# Patient Record
Sex: Male | Born: 1975 | Race: White | Hispanic: No | Marital: Single | Smoking: Never smoker
Health system: Southern US, Community
[De-identification: ages and names within clinical notes are randomized; demographics above are authoritative.]

## PROBLEM LIST (undated history)

## (undated) DIAGNOSIS — A4902 Methicillin resistant Staphylococcus aureus infection, unspecified site: Secondary | ICD-10-CM

## (undated) DIAGNOSIS — Z87442 Personal history of urinary calculi: Secondary | ICD-10-CM

## (undated) DIAGNOSIS — M1990 Unspecified osteoarthritis, unspecified site: Secondary | ICD-10-CM

## (undated) DIAGNOSIS — K219 Gastro-esophageal reflux disease without esophagitis: Secondary | ICD-10-CM

## (undated) HISTORY — PX: HERNIA REPAIR: SHX51

## (undated) HISTORY — PX: WISDOM TOOTH EXTRACTION: SHX21

---

## 2018-08-30 DIAGNOSIS — R6889 Other general symptoms and signs: Secondary | ICD-10-CM | POA: Diagnosis not present

## 2018-08-30 DIAGNOSIS — J029 Acute pharyngitis, unspecified: Secondary | ICD-10-CM | POA: Diagnosis not present

## 2018-08-30 DIAGNOSIS — Z6841 Body Mass Index (BMI) 40.0 and over, adult: Secondary | ICD-10-CM | POA: Diagnosis not present

## 2019-01-21 DIAGNOSIS — J301 Allergic rhinitis due to pollen: Secondary | ICD-10-CM | POA: Diagnosis not present

## 2019-01-21 DIAGNOSIS — F988 Other specified behavioral and emotional disorders with onset usually occurring in childhood and adolescence: Secondary | ICD-10-CM | POA: Diagnosis not present

## 2019-01-21 DIAGNOSIS — Z79899 Other long term (current) drug therapy: Secondary | ICD-10-CM | POA: Diagnosis not present

## 2019-01-21 DIAGNOSIS — G43909 Migraine, unspecified, not intractable, without status migrainosus: Secondary | ICD-10-CM | POA: Diagnosis not present

## 2019-01-21 DIAGNOSIS — Z0000 Encounter for general adult medical examination without abnormal findings: Secondary | ICD-10-CM | POA: Diagnosis not present

## 2019-04-04 DIAGNOSIS — Z2089 Contact with and (suspected) exposure to other communicable diseases: Secondary | ICD-10-CM | POA: Diagnosis not present

## 2019-04-24 DIAGNOSIS — B349 Viral infection, unspecified: Secondary | ICD-10-CM | POA: Diagnosis not present

## 2019-04-24 DIAGNOSIS — J020 Streptococcal pharyngitis: Secondary | ICD-10-CM | POA: Diagnosis not present

## 2019-04-28 DIAGNOSIS — F988 Other specified behavioral and emotional disorders with onset usually occurring in childhood and adolescence: Secondary | ICD-10-CM | POA: Diagnosis not present

## 2019-04-28 DIAGNOSIS — B354 Tinea corporis: Secondary | ICD-10-CM | POA: Diagnosis not present

## 2019-04-28 DIAGNOSIS — G43909 Migraine, unspecified, not intractable, without status migrainosus: Secondary | ICD-10-CM | POA: Diagnosis not present

## 2019-04-28 DIAGNOSIS — F524 Premature ejaculation: Secondary | ICD-10-CM | POA: Diagnosis not present

## 2019-10-21 DIAGNOSIS — B349 Viral infection, unspecified: Secondary | ICD-10-CM | POA: Diagnosis not present

## 2019-10-21 DIAGNOSIS — J020 Streptococcal pharyngitis: Secondary | ICD-10-CM | POA: Diagnosis not present

## 2021-08-03 NOTE — Progress Notes (Signed)
Sent message, via epic in basket, requesting orders in epic from surgeon.  

## 2021-08-04 NOTE — H&P (Signed)
  Patient's anticipated LOS is less than 2 midnights, meeting these requirements: - Younger than 50 - Lives within 1 hour of care - Has a competent adult at home to recover with post-op recover - NO history of  - Chronic pain requiring opiods  - Diabetes  - Coronary Artery Disease  - Heart failure  - Heart attack  - Stroke  - DVT/VTE  - Cardiac arrhythmia  - Respiratory Failure/COPD  - Renal failure  - Anemia  - Advanced Liver disease     Bryan Pitts is an 45 y.o. male.    Chief Complaint: right shoulder pain  HPI: Pt is a 45 y.o. male complaining of right shoulder pain for multiple years. Pain had continually increased since the beginning. X-rays in the clinic show end-stage arthritic changes of the right shoulder. Pt has tried various conservative treatments which have failed to alleviate their symptoms, including injections and therapy. Various options are discussed with the patient. Risks, benefits and expectations were discussed with the patient. Patient understand the risks, benefits and expectations and wishes to proceed with surgery.   PCP:  No primary care provider on file.  D/C Plans: Home  PMH: No past medical history on file.    Social History:  has no history on file for tobacco use, alcohol use, and drug use.  Allergies:  Not on File  Medications: No current facility-administered medications for this encounter.   No current outpatient medications on file.    No results found for this or any previous visit (from the past 48 hour(s)). No results found.  ROS: Pain with rom of the right upper extremity  Physical Exam: Alert and oriented 45 y.o. male in no acute distress Cranial nerves 2-12 intact Cervical spine: full rom with no tenderness, nv intact distally Chest: active breath sounds bilaterally, no wheeze rhonchi or rales Heart: regular rate and rhythm, no murmur Abd: non tender non distended with active bowel sounds Hip is stable with  rom  Right shoulder painful rom with crepitus Nv intact distally No rashes or edema Good strength with ER and IR  Assessment/Plan Assessment: right shoulder end stage osteoarthritis  Plan:  Patient will undergo a right total shoulder by Dr. Ranell Patrick at [facility] Risks benefits and expectations were discussed with the patient. Patient understand risks, benefits and expectations and wishes to proceed. Preoperative templating of the joint replacement has been completed, documented, and submitted to the Operating Room personnel in order to optimize intra-operative equipment management.   Alphonsa Overall PA-C, MPAS [facility] is now [employer] [address], [address], [place], [place] [address] Phone: [phone] [email or web address] Facebook  [employer]

## 2021-08-23 NOTE — Patient Instructions (Addendum)
DUE TO COVID-19 ONLY ONE VISITOR IS ALLOWED TO COME WITH YOU AND STAY IN THE WAITING ROOM ONLY DURING PRE OP AND PROCEDURE.   **NO VISITORS ARE ALLOWED IN THE SHORT STAY AREA OR RECOVERY ROOM!!**   You are not required to quarantine, however you are required to wear a well-fitted mask when you are out and around people not in your household.  Hand Hygiene often Do NOT share personal items Notify your provider if you are in close contact with someone who has COVID or you develop fever 100.4 or greater, new onset of sneezing, cough, sore throat, shortness of breath or body aches.        Your procedure is scheduled on:  Friday, 08-26-21   Report to [facility] Main  Entrance     Report to admitting at 9:45 AM   Call this number if you have problems the morning of surgery [phone]   Do not eat food :After Midnight.   May have liquids until 9:20 AM day of surgery  CLEAR LIQUID DIET  Foods Allowed                                                                     Foods Excluded  Water, Black Coffee (no milk/no creamer) and tea, regular and decaf                              liquids that you cannot  Plain Jell-O in any flavor  (No red)                         see through such as: Fruit ices (not with fruit pulp)                                 milk, soups, orange juice  Iced Popsicles (No red)                                    All solid food                             Apple juices Sports drinks like Gatorade (No red) Lightly seasoned clear broth or consume(fat free) Sugar     Complete one Ensure drink the morning of surgery at 9:20 AM the day of surgery.     The day of surgery:  Drink ONE (1) Pre-Surgery Clear Ensure the morning of surgery. Drink in one sitting. Do not sip.  This drink was given to you during your hospital  pre-op appointment visit. Nothing else to drink after completing the Pre-Surgery Clear Ensure           If you have questions, please  contact your surgeon's office.     Oral Hygiene is also important to reduce your risk of infection.  Remember - BRUSH YOUR TEETH THE MORNING OF SURGERY WITH YOUR REGULAR TOOTHPASTE   Do NOT smoke after Midnight   Take these medicines the morning of surgery with A SIP OF WATER:  Tylenol   Stop all vitamins and herbal supplements a week before surgery             You may not have any metal on your body including  jewelry, and body piercing             Do not wear lotions, powders, cologne, or deodorant              Men may shave face and neck.  Do not bring valuables to the hospital. [facility] IS NOT RESPONSIBLE FOR VALUABLES.   Contacts, dentures or bridgework may not be worn into surgery.    Patients discharged the day of surgery will not be allowed to drive home.  Special Instructions: Bring a copy of your healthcare power of attorney and living will documents the day of surgery if you haven't scanned them in before.  Please read over the following fact sheets you were given: IF YOU HAVE QUESTIONS ABOUT YOUR PRE OP INSTRUCTIONS PLEASE CALL [phone] [facility]- Preparing for Total Shoulder Arthroplasty    Before surgery, you can play an important role. Because skin is not sterile, your skin needs to be as free of germs as possible. You can reduce the number of germs on your skin by using the following products. Benzoyl Peroxide Gel Reduces the number of germs present on the skin Applied twice a day to shoulder area starting two days before surgery    ==================================================================  Please follow these instructions carefully:  BENZOYL PEROXIDE 5% GEL  Please do not use if you have an allergy to benzoyl peroxide.   If your skin becomes reddened/irritated stop using the benzoyl peroxide.  Starting two days before surgery, apply as follows: Apply benzoyl peroxide in the morning and at night.  Apply after taking a shower. If you are not taking a shower clean entire shoulder front, back, and side along with the armpit with a clean wet washcloth.  Place a quarter-sized dollop on your shoulder and rub in thoroughly, making sure to cover the front, back, and side of your shoulder, along with the armpit.   2 days before ____ AM   ____ PM              1 day before ____ AM   ____ PM                         Do this twice a day for two days.  (Last application is the night before surgery, AFTER using the CHG soap as described below).  Do NOT apply benzoyl peroxide gel on the day of surgery.    [facility] - Preparing for Surgery Before surgery, you can play an important role.  Because skin is not sterile, your skin needs to be as free of germs as possible.  You can reduce the number of germs on your skin by washing with CHG (chlorahexidine gluconate) soap before surgery.  CHG is an antiseptic cleaner which kills germs and bonds with the skin to continue killing germs even after washing. Please DO NOT use if you have an allergy to CHG or antibacterial soaps.  If your skin becomes reddened/irritated stop using the CHG and inform your nurse when you arrive at Short  Stay. Do not shave (including legs and underarms) for at least 48 hours prior to the first CHG shower.  You may shave your face/neck.  Please follow these instructions carefully:  1.  Shower with CHG Soap the night before surgery and the  morning of surgery.  2.  If you choose to wash your hair, wash your hair first as usual with your normal  shampoo.  3.  After you shampoo, rinse your hair and body thoroughly to remove the shampoo.                             4.  Use CHG as you would any other liquid soap.  You can apply chg directly to the skin and wash.  Gently with a scrungie or clean washcloth.  5.  Apply the CHG Soap to your body ONLY FROM THE NECK DOWN.   Do   not use on face/ open                           Wound or open sores.  Avoid contact with eyes, ears mouth and   genitals (private parts).                       Wash face,  Genitals (private parts) with your normal soap.             6.  Wash thoroughly, paying special attention to the area where your    surgery  will be performed.  7.  Thoroughly rinse your body with warm water from the neck down.  8.  DO NOT shower/wash with your normal soap after using and rinsing off the CHG Soap.                9.  Pat yourself dry with a clean towel.            10.  Wear clean pajamas.            11.  Place clean sheets on your bed the night of your first shower and do not  sleep with pets. Day of Surgery : Do not apply any lotions/deodorants the morning of surgery.  Please wear clean clothes to the hospital/surgery center.  FAILURE TO FOLLOW THESE INSTRUCTIONS MAY RESULT IN THE CANCELLATION OF YOUR SURGERY  PATIENT SIGNATURE_________________________________  NURSE SIGNATURE__________________________________  ________________________________________________________________________   Rogelia Mire  An incentive spirometer is a tool that can help keep your lungs clear and active. This tool measures how well you are filling your lungs with each breath. Taking long deep breaths may help reverse or decrease the chance of developing breathing (pulmonary) problems (especially infection) following: A long period of time when you are unable to move or be active. BEFORE THE PROCEDURE  If the spirometer includes an indicator to show your best effort, your nurse or respiratory therapist will set it to a desired goal. If possible, sit up straight or lean slightly forward. Try not to slouch. Hold the incentive spirometer in an upright position. INSTRUCTIONS FOR USE  Sit on the edge of your bed if possible, or sit up as far as you can in bed or on a chair. Hold the incentive spirometer in an upright position. Breathe out normally. Place the mouthpiece in your mouth and seal  your lips tightly around it. Breathe in slowly and as deeply as possible, raising the piston  or the ball toward the top of the column. Hold your breath for 3-5 seconds or for as long as possible. Allow the piston or ball to fall to the bottom of the column. Remove the mouthpiece from your mouth and breathe out normally. Rest for a few seconds and repeat Steps 1 through 7 at least 10 times every 1-2 hours when you are awake. Take your time and take a few normal breaths between deep breaths. The spirometer may include an indicator to show your best effort. Use the indicator as a goal to work toward during each repetition. After each set of 10 deep breaths, practice coughing to be sure your lungs are clear. If you have an incision (the cut made at the time of surgery), support your incision when coughing by placing a pillow or rolled up towels firmly against it. Once you are able to get out of bed, walk around indoors and cough well. You may stop using the incentive spirometer when instructed by your caregiver.  RISKS AND COMPLICATIONS Take your time so you do not get dizzy or light-headed. If you are in pain, you may need to take or ask for pain medication before doing incentive spirometry. It is harder to take a deep breath if you are having pain. AFTER USE Rest and breathe slowly and easily. It can be helpful to keep track of a log of your progress. Your caregiver can provide you with a simple table to help with this. If you are using the spirometer at home, follow these instructions: SEEK MEDICAL CARE IF:  You are having difficultly using the spirometer. You have trouble using the spirometer as often as instructed. Your pain medication is not giving enough relief while using the spirometer. You develop fever of 100.5 F (38.1 C) or higher. SEEK IMMEDIATE MEDICAL CARE IF:  You cough up bloody sputum that had not been present before. You develop fever of 102 F (38.9 C) or greater. You  develop worsening pain at or near the incision site. MAKE SURE YOU:  Understand these instructions. Will watch your condition. Will get help right away if you are not doing well or get worse. Document Released: 01/15/2007 Document Revised: 11/27/2011 Document Reviewed: 03/18/2007 [facility] Patient Information 2014 [place], [place].   ________________________________________________________________________

## 2021-08-24 ENCOUNTER — Encounter (HOSPITAL_COMMUNITY)
Admission: RE | Admit: 2021-08-24 | Discharge: 2021-08-24 | Disposition: A | Discharge status: Home / Self Care | Payer: No Typology Code available for payment source | Source: Ambulatory Visit | Attending: Orthopedic Surgery | Admitting: Orthopedic Surgery

## 2021-08-24 ENCOUNTER — Other Ambulatory Visit: Payer: Self-pay

## 2021-08-24 ENCOUNTER — Encounter (INDEPENDENT_AMBULATORY_CARE_PROVIDER_SITE_OTHER): Payer: Self-pay

## 2021-08-24 ENCOUNTER — Encounter (HOSPITAL_COMMUNITY): Payer: Self-pay

## 2021-08-24 VITALS — BP 133/87 | HR 86 | Temp 98.2°F | Resp 18 | Ht 72.0 in | Wt 312.8 lb

## 2021-08-24 DIAGNOSIS — Z01818 Encounter for other preprocedural examination: Secondary | ICD-10-CM

## 2021-08-24 DIAGNOSIS — Z01812 Encounter for preprocedural laboratory examination: Secondary | ICD-10-CM | POA: Insufficient documentation

## 2021-08-24 HISTORY — DX: Personal history of urinary calculi: Z87.442

## 2021-08-24 HISTORY — DX: Unspecified osteoarthritis, unspecified site: M19.90

## 2021-08-24 HISTORY — DX: Gastro-esophageal reflux disease without esophagitis: K21.9

## 2021-08-24 HISTORY — DX: Methicillin resistant Staphylococcus aureus infection, unspecified site: A49.02

## 2021-08-24 LAB — CBC
HCT: 50.5 % (ref 39.0–52.0)
Hemoglobin: 16.4 g/dL (ref 13.0–17.0)
MCH: 28.1 pg (ref 26.0–34.0)
MCHC: 32.5 g/dL (ref 30.0–36.0)
MCV: 86.6 fL (ref 80.0–100.0)
Platelets: 236 10*3/uL (ref 150–400)
RBC: 5.83 MIL/uL — ABNORMAL HIGH (ref 4.22–5.81)
RDW: 13.3 % (ref 11.5–15.5)
WBC: 6.9 10*3/uL (ref 4.0–10.5)
nRBC: 0 % (ref 0.0–0.2)

## 2021-08-24 LAB — SURGICAL PCR SCREEN
MRSA, PCR: NEGATIVE
Staphylococcus aureus: NEGATIVE

## 2021-08-24 NOTE — Progress Notes (Signed)
COVID swab appointment:N/A  COVID Vaccine Completed:  Yes x2 Date COVID Vaccine completed: Has received booster: COVID vaccine manufacturer: Pfizer      Date of COVID positive in last 90 days:  No  PCP - Camera operator - N/A  Chest x-ray - N/A EKG - N/A Stress Test - N/A ECHO - N/A Cardiac Cath - N/A Pacemaker/ICD device last checked: Spinal Cord Stimulator:  Sleep Study - Yes, neg sleep apnea CPAP - No  Fasting Blood Sugar - N/A Checks Blood Sugar _____ times a day  Blood Thinner Instructions:N/A Aspirin Instructions: Last Dose:  Activity level:  Can go up a flight of stairs and perform activities of daily living without stopping and without symptoms of chest pain or shortness of breath.    Able to exercise without symptoms   Anesthesia review:  N/A  Patient denies shortness of breath, fever, cough and chest pain at PAT appointment  Patient verbalized understanding of instructions that were given to them at the PAT appointment. Patient was also instructed that they will need to review over the PAT instructions again at home before surgery.

## 2021-08-26 ENCOUNTER — Encounter (HOSPITAL_COMMUNITY)
Admission: RE | Disposition: A | Discharge status: Home / Self Care | Payer: Self-pay | Source: Ambulatory Visit | Attending: Orthopedic Surgery

## 2021-08-26 ENCOUNTER — Ambulatory Visit (HOSPITAL_COMMUNITY): Payer: No Typology Code available for payment source | Admitting: Certified Registered Nurse Anesthetist

## 2021-08-26 ENCOUNTER — Ambulatory Visit (HOSPITAL_COMMUNITY): Payer: Commercial Managed Care - PPO | Attending: Orthopedic Surgery

## 2021-08-26 ENCOUNTER — Ambulatory Visit (HOSPITAL_COMMUNITY)
Admission: RE | Admit: 2021-08-26 | Discharge: 2021-08-26 | Disposition: A | Discharge status: Home / Self Care | Payer: No Typology Code available for payment source | Source: Ambulatory Visit | Attending: Orthopedic Surgery | Admitting: Orthopedic Surgery

## 2021-08-26 ENCOUNTER — Encounter (HOSPITAL_COMMUNITY): Payer: Self-pay | Admitting: Orthopedic Surgery

## 2021-08-26 DIAGNOSIS — M19011 Primary osteoarthritis, right shoulder: Secondary | ICD-10-CM | POA: Insufficient documentation

## 2021-08-26 DIAGNOSIS — Z96611 Presence of right artificial shoulder joint: Secondary | ICD-10-CM | POA: Diagnosis present

## 2021-08-26 HISTORY — PX: TOTAL SHOULDER ARTHROPLASTY: SHX126

## 2021-08-26 SURGERY — ARTHROPLASTY, SHOULDER, TOTAL
Anesthesia: Regional | Site: Shoulder | Laterality: Right

## 2021-08-26 MED ORDER — STERILE WATER FOR IRRIGATION IR SOLN
Status: DC | PRN
  Administered 2021-08-26: 2000 mL

## 2021-08-26 MED ORDER — FENTANYL CITRATE PF 50 MCG/ML IJ SOSY
50.0000 ug | PREFILLED_SYRINGE | INTRAMUSCULAR | Status: DC
  Administered 2021-08-26: 100 ug via INTRAVENOUS
  Filled 2021-08-26: qty 2

## 2021-08-26 MED ORDER — LIDOCAINE HCL (PF) 2 % IJ SOLN
INTRAMUSCULAR | Status: AC
  Filled 2021-08-26: qty 5

## 2021-08-26 MED ORDER — THROMBIN (RECOMBINANT) 5000 UNITS EX SOLR
CUTANEOUS | Status: AC
  Filled 2021-08-26: qty 5000

## 2021-08-26 MED ORDER — ORAL CARE MOUTH RINSE: 15.0000 mL | Freq: Once | OROMUCOSAL | Status: AC

## 2021-08-26 MED ORDER — FENTANYL CITRATE (PF) 100 MCG/2ML IJ SOLN
INTRAMUSCULAR | Status: DC | PRN
  Administered 2021-08-26: 100 ug via INTRAVENOUS

## 2021-08-26 MED ORDER — SUGAMMADEX SODIUM 200 MG/2ML IV SOLN
INTRAVENOUS | Status: DC | PRN
  Administered 2021-08-26: 300 mg via INTRAVENOUS

## 2021-08-26 MED ORDER — EPHEDRINE 5 MG/ML INJ
INTRAVENOUS | Status: AC
  Filled 2021-08-26: qty 5

## 2021-08-26 MED ORDER — PHENYLEPHRINE 40 MCG/ML (10ML) SYRINGE FOR IV PUSH (FOR BLOOD PRESSURE SUPPORT)
PREFILLED_SYRINGE | INTRAVENOUS | Status: AC
  Filled 2021-08-26: qty 10

## 2021-08-26 MED ORDER — LIDOCAINE 2% (20 MG/ML) 5 ML SYRINGE
INTRAMUSCULAR | Status: DC | PRN
  Administered 2021-08-26: 60 mg via INTRAVENOUS

## 2021-08-26 MED ORDER — ROCURONIUM BROMIDE 10 MG/ML (PF) SYRINGE
PREFILLED_SYRINGE | INTRAVENOUS | Status: DC | PRN
  Administered 2021-08-26: 70 mg via INTRAVENOUS

## 2021-08-26 MED ORDER — DEXAMETHASONE SODIUM PHOSPHATE 10 MG/ML IJ SOLN
INTRAMUSCULAR | Status: AC
  Filled 2021-08-26: qty 1

## 2021-08-26 MED ORDER — ONDANSETRON HCL 4 MG PO TABS: 4.0000 mg | ORAL_TABLET | Freq: Three times a day (TID) | ORAL | 1 refills | Status: AC | PRN

## 2021-08-26 MED ORDER — 0.9 % SODIUM CHLORIDE (POUR BTL) OPTIME
TOPICAL | Status: DC | PRN
  Administered 2021-08-26: 1000 mL

## 2021-08-26 MED ORDER — BUPIVACAINE HCL (PF) 0.5 % IJ SOLN
INTRAMUSCULAR | Status: DC | PRN
  Administered 2021-08-26: 20 mL via PERINEURAL

## 2021-08-26 MED ORDER — ACETAMINOPHEN 10 MG/ML IV SOLN: 1000.0000 mg | Freq: Once | INTRAVENOUS | Status: DC | PRN

## 2021-08-26 MED ORDER — THROMBIN 5000 UNITS EX SOLR
CUTANEOUS | Status: DC | PRN
  Administered 2021-08-26: 5000 [IU] via TOPICAL

## 2021-08-26 MED ORDER — CHLORHEXIDINE GLUCONATE 0.12 % MT SOLN
15.0000 mL | Freq: Once | OROMUCOSAL | Status: AC
  Administered 2021-08-26: 15 mL via OROMUCOSAL

## 2021-08-26 MED ORDER — BUPIVACAINE-EPINEPHRINE (PF) 0.25% -1:200000 IJ SOLN
INTRAMUSCULAR | Status: DC | PRN
  Administered 2021-08-26: 15 mL

## 2021-08-26 MED ORDER — CEFAZOLIN IN SODIUM CHLORIDE 3-0.9 GM/100ML-% IV SOLN
3.0000 g | INTRAVENOUS | Status: AC
  Administered 2021-08-26: 3 g via INTRAVENOUS
  Filled 2021-08-26: qty 100

## 2021-08-26 MED ORDER — ONDANSETRON HCL 4 MG/2ML IJ SOLN
INTRAMUSCULAR | Status: AC
  Filled 2021-08-26: qty 2

## 2021-08-26 MED ORDER — FENTANYL CITRATE PF 50 MCG/ML IJ SOSY: 25.0000 ug | PREFILLED_SYRINGE | INTRAMUSCULAR | Status: DC | PRN

## 2021-08-26 MED ORDER — PROPOFOL 10 MG/ML IV BOLUS
INTRAVENOUS | Status: DC | PRN
  Administered 2021-08-26: 200 mg via INTRAVENOUS

## 2021-08-26 MED ORDER — DEXAMETHASONE SODIUM PHOSPHATE 10 MG/ML IJ SOLN
INTRAMUSCULAR | Status: DC | PRN
  Administered 2021-08-26: 10 mg via INTRAVENOUS

## 2021-08-26 MED ORDER — PHENYLEPHRINE HCL-NACL 20-0.9 MG/250ML-% IV SOLN
INTRAVENOUS | Status: DC | PRN
  Administered 2021-08-26: 35 ug/min via INTRAVENOUS

## 2021-08-26 MED ORDER — PROMETHAZINE HCL 25 MG/ML IJ SOLN: 6.2500 mg | INTRAMUSCULAR | Status: DC | PRN

## 2021-08-26 MED ORDER — EPHEDRINE SULFATE-NACL 50-0.9 MG/10ML-% IV SOSY
PREFILLED_SYRINGE | INTRAVENOUS | Status: DC | PRN
  Administered 2021-08-26 (×4): 5 mg via INTRAVENOUS

## 2021-08-26 MED ORDER — ONDANSETRON HCL 4 MG/2ML IJ SOLN
INTRAMUSCULAR | Status: DC | PRN
  Administered 2021-08-26: 4 mg via INTRAVENOUS

## 2021-08-26 MED ORDER — BUPIVACAINE LIPOSOME 1.3 % IJ SUSP
INTRAMUSCULAR | Status: DC | PRN
  Administered 2021-08-26: 10 mL via PERINEURAL

## 2021-08-26 MED ORDER — BUPIVACAINE-EPINEPHRINE (PF) 0.25% -1:200000 IJ SOLN
INTRAMUSCULAR | Status: AC
  Filled 2021-08-26: qty 30

## 2021-08-26 MED ORDER — FENTANYL CITRATE (PF) 100 MCG/2ML IJ SOLN
INTRAMUSCULAR | Status: AC
  Filled 2021-08-26: qty 2

## 2021-08-26 MED ORDER — MIDAZOLAM HCL 2 MG/2ML IJ SOLN
1.0000 mg | INTRAMUSCULAR | Status: DC
  Administered 2021-08-26: 2 mg via INTRAVENOUS
  Filled 2021-08-26: qty 2

## 2021-08-26 MED ORDER — OXYCODONE-ACETAMINOPHEN 5-325 MG PO TABS
1.0000 | ORAL_TABLET | ORAL | 0 refills | Status: AC | PRN
Start: 2021-08-26 — End: 2022-08-26

## 2021-08-26 MED ORDER — ROCURONIUM BROMIDE 10 MG/ML (PF) SYRINGE
PREFILLED_SYRINGE | INTRAVENOUS | Status: AC
  Filled 2021-08-26: qty 10

## 2021-08-26 MED ORDER — LACTATED RINGERS IV SOLN: INTRAVENOUS | Status: DC

## 2021-08-26 MED ORDER — PROPOFOL 10 MG/ML IV BOLUS
INTRAVENOUS | Status: AC
  Filled 2021-08-26: qty 20

## 2021-08-26 MED ORDER — METHOCARBAMOL 500 MG PO TABS: 500.0000 mg | ORAL_TABLET | Freq: Four times a day (QID) | ORAL | 1 refills | Status: AC | PRN

## 2021-08-26 SURGICAL SUPPLY — 57 items
BAG COUNTER SPONGE SURGICOUNT (BAG) ×2 IMPLANT
BAG ZIPLOCK 12X15 (MISCELLANEOUS) ×2 IMPLANT
BIT DRILL 1.6MX128 (BIT) ×4 IMPLANT
BLADE SAG 18X100X1.27 (BLADE) ×2 IMPLANT
BODY UNITE ANATOMIC SZ12 (Miscellaneous) ×2 IMPLANT
CEMENT HV SMART SET (Cement) ×2 IMPLANT
COVER BACK TABLE 60X90IN (DRAPES) ×2 IMPLANT
COVER SURGICAL LIGHT HANDLE (MISCELLANEOUS) ×2 IMPLANT
DECANTER SPIKE VIAL GLASS SM (MISCELLANEOUS) IMPLANT
DRAPE INCISE IOBAN 66X45 STRL (DRAPES) ×2 IMPLANT
DRAPE ORTHO SPLIT 77X108 STRL (DRAPES) ×2
DRAPE SHEET LG 3/4 BI-LAMINATE (DRAPES) ×2 IMPLANT
DRAPE SURG ORHT 6 SPLT 77X108 (DRAPES) ×2 IMPLANT
DRAPE U-SHAPE 47X51 STRL (DRAPES) ×2 IMPLANT
DRSG ADAPTIC 3X8 NADH LF (GAUZE/BANDAGES/DRESSINGS) ×2 IMPLANT
DRSG PAD ABDOMINAL 8X10 ST (GAUZE/BANDAGES/DRESSINGS) ×2 IMPLANT
DURAPREP 26ML APPLICATOR (WOUND CARE) ×2 IMPLANT
ELECT BLADE TIP CTD 4 INCH (ELECTRODE) ×2 IMPLANT
ELECT NEEDLE TIP 2.8 STRL (NEEDLE) ×2 IMPLANT
ELECT REM PT RETURN 15FT ADLT (MISCELLANEOUS) ×2 IMPLANT
GAUZE SPONGE 4X4 12PLY STRL (GAUZE/BANDAGES/DRESSINGS) ×2 IMPLANT
GLENOID ANCHOR PEG CROSSLK 48 (Orthopedic Implant) ×2 IMPLANT
GLOVE SURG ORTHO LTX SZ7.5 (GLOVE) ×2 IMPLANT
GLOVE SURG ORTHO LTX SZ8.5 (GLOVE) ×2 IMPLANT
GLOVE SURG UNDER POLY LF SZ7.5 (GLOVE) ×2 IMPLANT
GLOVE SURG UNDER POLY LF SZ8.5 (GLOVE) ×2 IMPLANT
GOWN STRL REUS W/TWL XL LVL3 (GOWN DISPOSABLE) ×4 IMPLANT
HEAD HUMERAL ECCEN 52MX21M (Head) ×2 IMPLANT
KIT BASIN OR (CUSTOM PROCEDURE TRAY) ×2 IMPLANT
KIT TURNOVER KIT A (KITS) IMPLANT
MANIFOLD NEPTUNE II (INSTRUMENTS) ×2 IMPLANT
NEEDLE MAYO CATGUT SZ4 (NEEDLE) ×2 IMPLANT
PACK SHOULDER (CUSTOM PROCEDURE TRAY) ×2 IMPLANT
PASSER SUT SWANSON 36MM LOOP (INSTRUMENTS) ×2 IMPLANT
PIN METAGLENE 2.5 (PIN) ×2 IMPLANT
PROTECTOR NERVE ULNAR (MISCELLANEOUS) ×2 IMPLANT
RESTRAINT HEAD UNIVERSAL NS (MISCELLANEOUS) ×2 IMPLANT
SLING ARM FOAM STRAP LRG (SOFTGOODS) ×2 IMPLANT
SMARTMIX MINI TOWER (MISCELLANEOUS) ×2
SPONGE SURGIFOAM ABS GEL 100 (HEMOSTASIS) ×2 IMPLANT
SPONGE SURGIFOAM ABS GEL 12-7 (HEMOSTASIS) ×2 IMPLANT
SPONGE T-LAP 18X18 ~~LOC~~+RFID (SPONGE) ×2 IMPLANT
SPONGE T-LAP 4X18 ~~LOC~~+RFID (SPONGE) ×2 IMPLANT
STEM UNITE SZ12 (Stem) ×2 IMPLANT
STRIP CLOSURE SKIN 1/2X4 (GAUZE/BANDAGES/DRESSINGS) ×4 IMPLANT
SUCTION FRAZIER HANDLE 12FR (TUBING) ×1
SUCTION TUBE FRAZIER 12FR DISP (TUBING) ×1 IMPLANT
SUT FIBERWIRE #2 38 T-5 BLUE (SUTURE) ×12
SUT MNCRL AB 4-0 PS2 18 (SUTURE) ×2 IMPLANT
SUT VIC AB 0 CT1 36 (SUTURE) ×2 IMPLANT
SUT VIC AB 0 CT2 27 (SUTURE) ×2 IMPLANT
SUT VIC AB 2-0 CT1 27 (SUTURE) ×2
SUT VIC AB 2-0 CT1 TAPERPNT 27 (SUTURE) ×2 IMPLANT
SUTURE FIBERWR #2 38 T-5 BLUE (SUTURE) ×6 IMPLANT
TAPE CLOTH SURG 6X10 WHT LF (GAUZE/BANDAGES/DRESSINGS) ×2 IMPLANT
TOWEL OR 17X26 10 PK STRL BLUE (TOWEL DISPOSABLE) ×2 IMPLANT
TOWER SMARTMIX MINI (MISCELLANEOUS) ×1 IMPLANT

## 2021-08-26 NOTE — Anesthesia Preprocedure Evaluation (Signed)
Anesthesia Evaluation  Patient identified by MRN, date of birth, ID band Patient awake    Reviewed: Allergy & Precautions, NPO status , Patient's Chart, lab work & pertinent test results  Airway Mallampati: II  TM Distance: >3 FB Neck ROM: Full    Dental no notable dental hx.    Pulmonary neg pulmonary ROS,    Pulmonary exam normal        Cardiovascular negative cardio ROS   Rhythm:Regular Rate:Normal     Neuro/Psych negative neurological ROS  negative psych ROS   GI/Hepatic Neg liver ROS, GERD  ,  Endo/Other  negative endocrine ROS  Renal/GU negative Renal ROS  negative genitourinary   Musculoskeletal  (+) Arthritis , Osteoarthritis,    Abdominal Normal abdominal exam  (+)   Peds  Hematology negative hematology ROS (+)   Anesthesia Other Findings   Reproductive/Obstetrics                             Anesthesia Physical Anesthesia Plan  ASA: 2  Anesthesia Plan: General and Regional   Post-op Pain Management: Regional block   Induction: Intravenous  PONV Risk Score and Plan: 2 and Ondansetron, Dexamethasone, Midazolam and Treatment may vary due to age or medical condition  Airway Management Planned: Mask and Oral ETT  Additional Equipment: None  Intra-op Plan:   Post-operative Plan: Extubation in OR  Informed Consent: I have reviewed the patients History and Physical, chart, labs and discussed the procedure including the risks, benefits and alternatives for the proposed anesthesia with the patient or authorized representative who has indicated his/her understanding and acceptance.     Dental advisory given  Plan Discussed with: CRNA  Anesthesia Plan Comments: (Lab Results      Component                Value               Date                      WBC                      6.9                 08/24/2021                HGB                      16.4                 08/24/2021                HCT                      50.5                08/24/2021                MCV                      86.6                08/24/2021                PLT  236                 08/24/2021          )        Anesthesia Quick Evaluation

## 2021-08-26 NOTE — Evaluation (Signed)
Occupational Therapy Evaluation Patient Details Name: Bryan Pitts MRN: [id number] DOB: 16-Oct-1975 Today's Date: 08/26/2021   History of Present Illness Patient s/p R TSA   Clinical Impression   Mr. Bryan Pitts is a  45 year old man s/p shoulder replacement without functional use of right dominant upper extremity secondary to effects of surgery and interscalene block and shoulder precautions. Therapist provided education and instruction to patient in regards to exercises, precautions, positioning, donning upper extremity clothing and bathing while maintaining shoulder precautions, ice and edema management and donning/doffing sling. Patient and spouse verbalized understanding and demonstrated as needed. Patient needed assistance to donn shirt and sling. Has assistance of family at home. Provided with handouts to maximize retention of education. Patient to follow up with MD for further therapy needs.       Recommendations for follow up therapy are one component of a multi-disciplinary discharge planning process, led by the attending physician.  Recommendations may be updated based on patient status, additional functional criteria and insurance authorization.   Follow Up Recommendations  Follow physician's recommendations for discharge plan and follow up therapies    Assistance Recommended at Discharge Intermittent Supervision/Assistance  Functional Status Assessment  Patient has had a recent decline in their functional status and demonstrates the ability to make significant improvements in function in a reasonable and predictable amount of time.  Equipment Recommendations  None recommended by OT    Recommendations for Other Services       Precautions / Restrictions Precautions Precautions: Shoulder Type of Shoulder Precautions: No AROM, No PROM Shoulder Interventions: Shoulder sling/immobilizer;At all times;Off for dressing/bathing/exercises Precaution Booklet Issued:   (handouts) Required Braces or Orthoses: Sling Restrictions Weight Bearing Restrictions: Yes RUE Weight Bearing: Non weight bearing      Mobility Bed Mobility Overal bed mobility: Independent                  Transfers Overall transfer level: Independent                        Balance Overall balance assessment: No apparent balance deficits (not formally assessed)                                         ADL either performed or assessed with clinical judgement   ADL                                               Vision Patient Visual Report: No change from baseline       Perception     Praxis      Pertinent Vitals/Pain Pain Assessment: No/denies pain     Hand Dominance     Extremity/Trunk Assessment Upper Extremity Assessment Upper Extremity Assessment: RUE deficits/detail RUE Deficits / Details: Impaired AROm secondary to block           Communication     Cognition Arousal/Alertness: Awake/alert Behavior During Therapy: WFL for tasks assessed/performed Overall Cognitive Status: Within Functional Limits for tasks assessed                                       General Comments  Exercises     Shoulder Instructions Shoulder Instructions Donning/doffing shirt without moving shoulder: Patient able to independently direct caregiver Method for sponge bathing under operated UE: Independent Donning/doffing sling/immobilizer: Patient able to independently direct caregiver Correct positioning of sling/immobilizer: Independent ROM for elbow, wrist and digits of operated UE: Independent Sling wearing schedule (on at all times/off for ADL's): Independent Dressing change: Independent Positioning of UE while sleeping: Independent    Home Living Family/patient expects to be discharged to:: Private residence Living Arrangements: Alone;Parent                                       Prior Functioning/Environment                          OT Problem List: Decreased strength;Impaired UE functional use;Pain;Decreased range of motion      OT Treatment/Interventions:      OT Goals(Current goals can be found in the care plan section) Acute Rehab OT Goals OT Goal Formulation: All assessment and education complete, DC therapy  OT Frequency:     Barriers to D/C:            Co-evaluation              AM-PAC OT "6 Clicks" Daily Activity     Outcome Measure Help from another person eating meals?: A Little Help from another person taking care of personal grooming?: A Little Help from another person toileting, which includes using toliet, bedpan, or urinal?: None Help from another person bathing (including washing, rinsing, drying)?: A Little Help from another person to put on and taking off regular upper body clothing?: A Lot Help from another person to put on and taking off regular lower body clothing?: A Little 6 Click Score: 18   End of Session    Activity Tolerance: Patient tolerated treatment well Patient left: in chair  OT Visit Diagnosis: Muscle weakness (generalized) (M62.81)                Time: 1779-3903 OT Time Calculation (min): 18 min Charges:  OT General Charges $OT Visit: 1 Visit OT Evaluation $OT Eval Low Complexity: 1 Low  Mikayela Deats, OTR/L Acute Care Rehab Services  Office [phone] Pager: [phone]   Kelli Churn 08/26/2021, 5:31 PM

## 2021-08-26 NOTE — Anesthesia Procedure Notes (Signed)
Procedure Name: Intubation Date/Time: 08/26/2021 1:14 PM Performed by: Maxwell Caul, CRNA Pre-anesthesia Checklist: Patient identified, Emergency Drugs available, Suction available and Patient being monitored Patient Re-evaluated:Patient Re-evaluated prior to induction Oxygen Delivery Method: Circle system utilized Preoxygenation: Pre-oxygenation with 100% oxygen Induction Type: IV induction Ventilation: Mask ventilation without difficulty Laryngoscope Size: Mac and 4 Grade View: Grade II Tube type: Oral Tube size: 7.5 mm Number of attempts: 1 Airway Equipment and Method: Stylet and Oral airway Placement Confirmation: ETT inserted through vocal cords under direct vision, positive ETCO2 and breath sounds checked- equal and bilateral Secured at: 22 cm Tube secured with: Tape Dental Injury: Teeth and Oropharynx as per pre-operative assessment

## 2021-08-26 NOTE — Brief Op Note (Signed)
08/26/2021  4:13 PM  PATIENT:  Bryan Pitts  45 y.o. male  PRE-OPERATIVE DIAGNOSIS:  Right shoulder end stage osteoarthritis  POST-OPERATIVE DIAGNOSIS:  Right shoulder end stage osteoarthritis  PROCEDURE:  Procedure(s) with comments: TOTAL SHOULDER ARTHROPLASTY (Right) - with ISB [employer]  SURGEON:  Surgeon(s) and Role:    Beverely Low, MD - Primary  PHYSICIAN ASSISTANT:   ASSISTANTS: Thea Gist, PA-C   ANESTHESIA:   regional and general  EBL:  300 mL   BLOOD ADMINISTERED:none  DRAINS: none   LOCAL MEDICATIONS USED:  MARCAINE     SPECIMEN:  No Specimen  DISPOSITION OF SPECIMEN:  N/A  COUNTS:  YES  TOURNIQUET:  * No tourniquets in log *  DICTATION: .Other Dictation: Dictation Number [id number]  PLAN OF CARE: Discharge to home after PACU  PATIENT DISPOSITION:  PACU - hemodynamically stable.   Delay start of Pharmacological VTE agent (>24hrs) due to surgical blood loss or risk of bleeding: not applicable

## 2021-08-26 NOTE — Anesthesia Procedure Notes (Addendum)
Anesthesia Regional Block: Interscalene brachial plexus block   Pre-Anesthetic Checklist: , timeout performed,  Correct Patient, Correct Site, Correct Laterality,  Correct Procedure, Correct Position, site marked,  Risks and benefits discussed,  Surgical consent,  Pre-op evaluation,  At surgeon's request and post-op pain management  Laterality: Right  Prep: Dura Prep       Needles:  Injection technique: Single-shot  Needle Type: Echogenic Stimulator Needle     Needle Length: 5cm  Needle Gauge: 20     Additional Needles:   Procedures:,,,, ultrasound used (permanent image in chart),,    Narrative:  Start time: 08/26/2021 12:07 PM End time: 08/26/2021 12:10 PM Injection made incrementally with aspirations every 5 mL.  Performed by: Personally  Anesthesiologist: Atilano Median, DO  Additional Notes: Patient identified. Risks/Benefits/Options discussed with patient including but not limited to bleeding, infection, nerve damage, failed block, incomplete pain control. Patient expressed understanding and wished to proceed. All questions were answered. Sterile technique was used throughout the entire procedure. Please see nursing notes for vital signs. Aspirated in 5cc intervals with injection for negative confirmation. Patient was given instructions on fall risk and not to get out of bed. All questions and concerns addressed with instructions to call with any issues or inadequate analgesia.

## 2021-08-26 NOTE — Progress Notes (Signed)
Assisted Dr. Greg Stoltzfus with right, ultrasound guided, interscalene  block. Side rails up, monitors on throughout procedure. See vital signs in flow sheet. Tolerated Procedure well. °

## 2021-08-26 NOTE — Discharge Instructions (Signed)
Ice to the shoulder constantly.  Keep the incision covered and clean and dry for one week, then ok to get it wet in the shower.  Do exercise as instructed several times per day.  DO NOT reach behind your back or push up out of a chair with the operative arm.  Use a sling while you are up and around for comfort, may remove while seated.  Keep pillow propped behind the operative elbow.  Follow up with Dr Ranell Patrick in two weeks in the office, call [phone] for appt   Call Dr Ranell Patrick at [phone] (cell) with any questions

## 2021-08-26 NOTE — Transfer of Care (Signed)
Immediate Anesthesia Transfer of Care Note  Patient: Bryan Pitts  Procedure(s) Performed: TOTAL SHOULDER ARTHROPLASTY (Right: Shoulder)  Patient Location: PACU  Anesthesia Type:GA combined with regional for post-op pain  Level of Consciousness: awake, alert , oriented and patient cooperative  Airway & Oxygen Therapy: Patient Spontanous Breathing and Patient connected to face mask oxygen  Post-op Assessment: Report given to RN and Post -op Vital signs reviewed and stable  Post vital signs: Reviewed and stable  Last Vitals:  Vitals Value Taken Time  BP 126/76 08/26/21 1621  Temp    Pulse 105 08/26/21 1622  Resp 14 08/26/21 1622  SpO2 97 % 08/26/21 1622  Vitals shown include unvalidated device data.  Last Pain:  Vitals:   08/26/21 1235  TempSrc:   PainSc: 0-No pain      Patients Stated Pain Goal: 3 (08/26/21 1205)  Complications: No notable events documented.

## 2021-08-26 NOTE — Anesthesia Postprocedure Evaluation (Signed)
Anesthesia Post Note  Patient: Engineer, structural  Procedure(s) Performed: TOTAL SHOULDER ARTHROPLASTY (Right: Shoulder)     Patient location during evaluation: PACU Anesthesia Type: Regional and General Level of consciousness: awake and alert Pain management: pain level controlled Vital Signs Assessment: post-procedure vital signs reviewed and stable Respiratory status: spontaneous breathing, nonlabored ventilation, respiratory function stable and patient connected to nasal cannula oxygen Cardiovascular status: blood pressure returned to baseline and stable Postop Assessment: no apparent nausea or vomiting Anesthetic complications: no   No notable events documented.  Last Vitals:  Vitals:   08/26/21 1645 08/26/21 1705  BP: 127/88 122/86  Pulse: 97 94  Resp: 11 14  Temp:  36.8 C  SpO2: 94% 95%    Last Pain:  Vitals:   08/26/21 1705  TempSrc:   PainSc: 0-No pain                 Earl Lites P Sira Adsit

## 2021-08-26 NOTE — Interval H&P Note (Signed)
History and Physical Interval Note:  08/26/2021 12:30 PM  Bryan Pitts  has presented today for surgery, with the diagnosis of Right shoulder end stage osteoarthritis.  The various methods of treatment have been discussed with the patient and family. After consideration of risks, benefits and other options for treatment, the patient has consented to  Procedure(s) with comments: TOTAL SHOULDER ARTHROPLASTY (Right) - with ISB as a surgical intervention.  The patient's history has been reviewed, patient examined, no change in status, stable for surgery.  I have reviewed the patient's chart and labs.  Questions were answered to the patient's satisfaction.     Verlee Rossetti

## 2021-08-27 NOTE — Op Note (Signed)
Bryan Pitts, KORBER MEDICAL RECORD NO: [id number] ACCOUNT NO: [id number] DATE OF BIRTH: [birth year] FACILITY: [facility] LOCATION: [facility] PHYSICIAN: Almedia Balls. Ranell Patrick, MD  Operative Report   DATE OF PROCEDURE: 08/26/2021  PREOPERATIVE DIAGNOSIS:  Right shoulder end-stage arthritis.  POSTOPERATIVE DIAGNOSIS:  Right shoulder end-stage arthritis.  PROCEDURE PERFORMED:  Right anatomic total shoulder replacement using DePuy Global Unite prosthesis with anchor peg glenoid.  ATTENDING SURGEON:  Malon Kindle, MD  ASSISTANT:  Modesto Charon, [place], who was scrubbed during the entire procedure and necessary for satisfactory completion of surgery.  ANESTHESIA:  General anesthesia was used plus interscalene block.  ESTIMATED BLOOD LOSS:  300 mL.  FLUID REPLACEMENT:  1500 mL crystalloid.  Instrument counts were correct.  No complications.  Perioperative antibiotics were given.  INDICATIONS:  The patient is a 45 year old male with worsening right shoulder pain and dysfunction secondary to end-stage glenohumeral osteoarthritis.  The patient has bone-on-bone findings on x-ray and MRI scan.  Rotator cuff is intact.  Given the  failure of conservative management over an extended period of time, we discussed options and recommended an anatomic shoulder replacement to eliminate pain and to restore function and informed consent obtained.  DESCRIPTION OF PROCEDURE:  After an adequate level of anesthesia was achieved, the patient was positioned in the modified beach chair position.  Right shoulder correctly identified and sterilely prepped and draped in the usual manner.  Timeout called,  verifying correct patient, correct site.  We entered the patient's shoulder using a standard deltopectoral approach, starting at the coracoid process, extending down to the anterior humerus.  Dissection down through subcutaneous tissues using Bovie  electrocautery.  We identified the cephalic vein and took that laterally  with the deltoid.  Pectoralis was taken medially.  Conjoined tendon identified and retracted medially.  We then placed our deep retractors.  We tenodesed the biceps in situ with 0  Vicryl figure-of-eight suture x2.  We then released the subscapularis in a subperiosteal peel type maneuver.  We then tagged the subscap for repair with #2 FiberWire suture in a modified Mason-Allen suture technique.  We then released the inferior  capsule.  We extended the shoulder to release that posterior inferior capsule as well with the Bovie.  We were careful to protect the axillary nerve.  Next, we placed the T-handled Crego elevator over the top of the humeral head adjacent to the rotator  cuff to protect the rotator cuff.  We then placed a reverse Hohmann medially and then we placed the elbow at the patient's side, externally rotating about 20-25 degrees and then used our neck cut guide to mark the humerus at the appropriate height. We  then cut anterior to posterior with the elbow at the side, about 25 degrees of external rotation to create 25 degrees of retroversion on the cut. Once we had that cut done, we removed the head and used that for bone graft.  Next, there were really no  osteophytes on the medial humeral neck.  We made sure with a finger sweep that, that was nice and smooth.  We then subluxed the humerus posteriorly, gaining good exposure of the glenoid cavity.  The patient had horrible synovitis.  We did a subtotal  synovectomy to get that hypertrophic synovial tissue out.  We removed the biceps stump and the labrum.  There was almost no cartilage on the glenoid face, just a little bit at the top of the glenoid, which we removed with a Cobb elevator.  We then found  our center point for a guide pin for a 48 glenoid, this was an anchor peg glenoid, it was the biggest we could get in there.  We drilled that guide pin.  We then reamed for the 48 glenoid and then did our peripheral hand reaming, drilled our  central peg  hole and then drilled our 3 peripheral holes with our guide.  We then impacted our trial 48 APG glenoid in place, which had good bony support, we removed that.  We used Gelfoam soaked in thrombin in the 3 peripheral holes while we vacuum mixed DePuy 1  cement on the back table.  We then used that cement in the 3 peripheral holes.  We impacted that glenoid into place and held it until the cement was hard. We had a nice secure glenoid with great bony support.  We then went back to the humeral side and  reamed to a size 12 and then broached for the size 12 Global Unite stem, which was at 25 degrees of retroversion. We then trialled with the 52, initially 18 eccentric, felt like we could do the 21 as we had the ability to translate that humeral head 50%  posteriorly and then also 50% inferiorly.  The head did spontaneously relocate, but there was definitely room for the 21 thickness, thus we went ahead and removed the trial components from the humeral side.  We drilled holes in the lesser tuberosity and  placed #2 FiberWire suture for repairs x3.  We then went ahead and used available bone graft and then impacted the press-fit stem, which was a size 12 with a 12 metaphysis and impacted that in place. With that secure,  we selected the real 52, 21  eccentric and impacted that with best bone coverage, dialing that eccentricity posterior superiorly.  At this point, we went ahead and irrigated thoroughly.  We reduced the shoulder.  We then anatomically repaired the subscapularis.  We had excellent  range of motion with no impingement and no limitation from the subscap repair.  We then irrigated again and closed the deltopectoral interval with 0 Vicryl suture followed by 2-0 Vicryl for subcutaneous closure and 4-0 Monocryl for skin.  Steri-Strips  were applied followed by sterile dressing and a shoulder sling.  The patient was transported to the recovery room in stable condition.   SHW D: 08/26/2021  4:21:12 pm T: 08/27/2021 2:06:00 am  JOB: [id number]/ [id number]

## 2021-08-30 NOTE — Addendum Note (Signed)
Addendum  created 08/30/21 0706 by Atilano Median, DO   Clinical Note Signed, Intraprocedure Blocks edited, SmartForm saved

## 2021-08-31 ENCOUNTER — Encounter (HOSPITAL_COMMUNITY): Payer: Self-pay | Admitting: Orthopedic Surgery

## 2022-11-14 IMAGING — DX DG SHOULDER 2+V PORT*R*
1 series · 1 of 1 positions shown · non-contrast
Comparison: None.

CLINICAL DATA: Right shoulder replacement.

EXAM:
PORTABLE RIGHT SHOULDER

[shoulder ap]
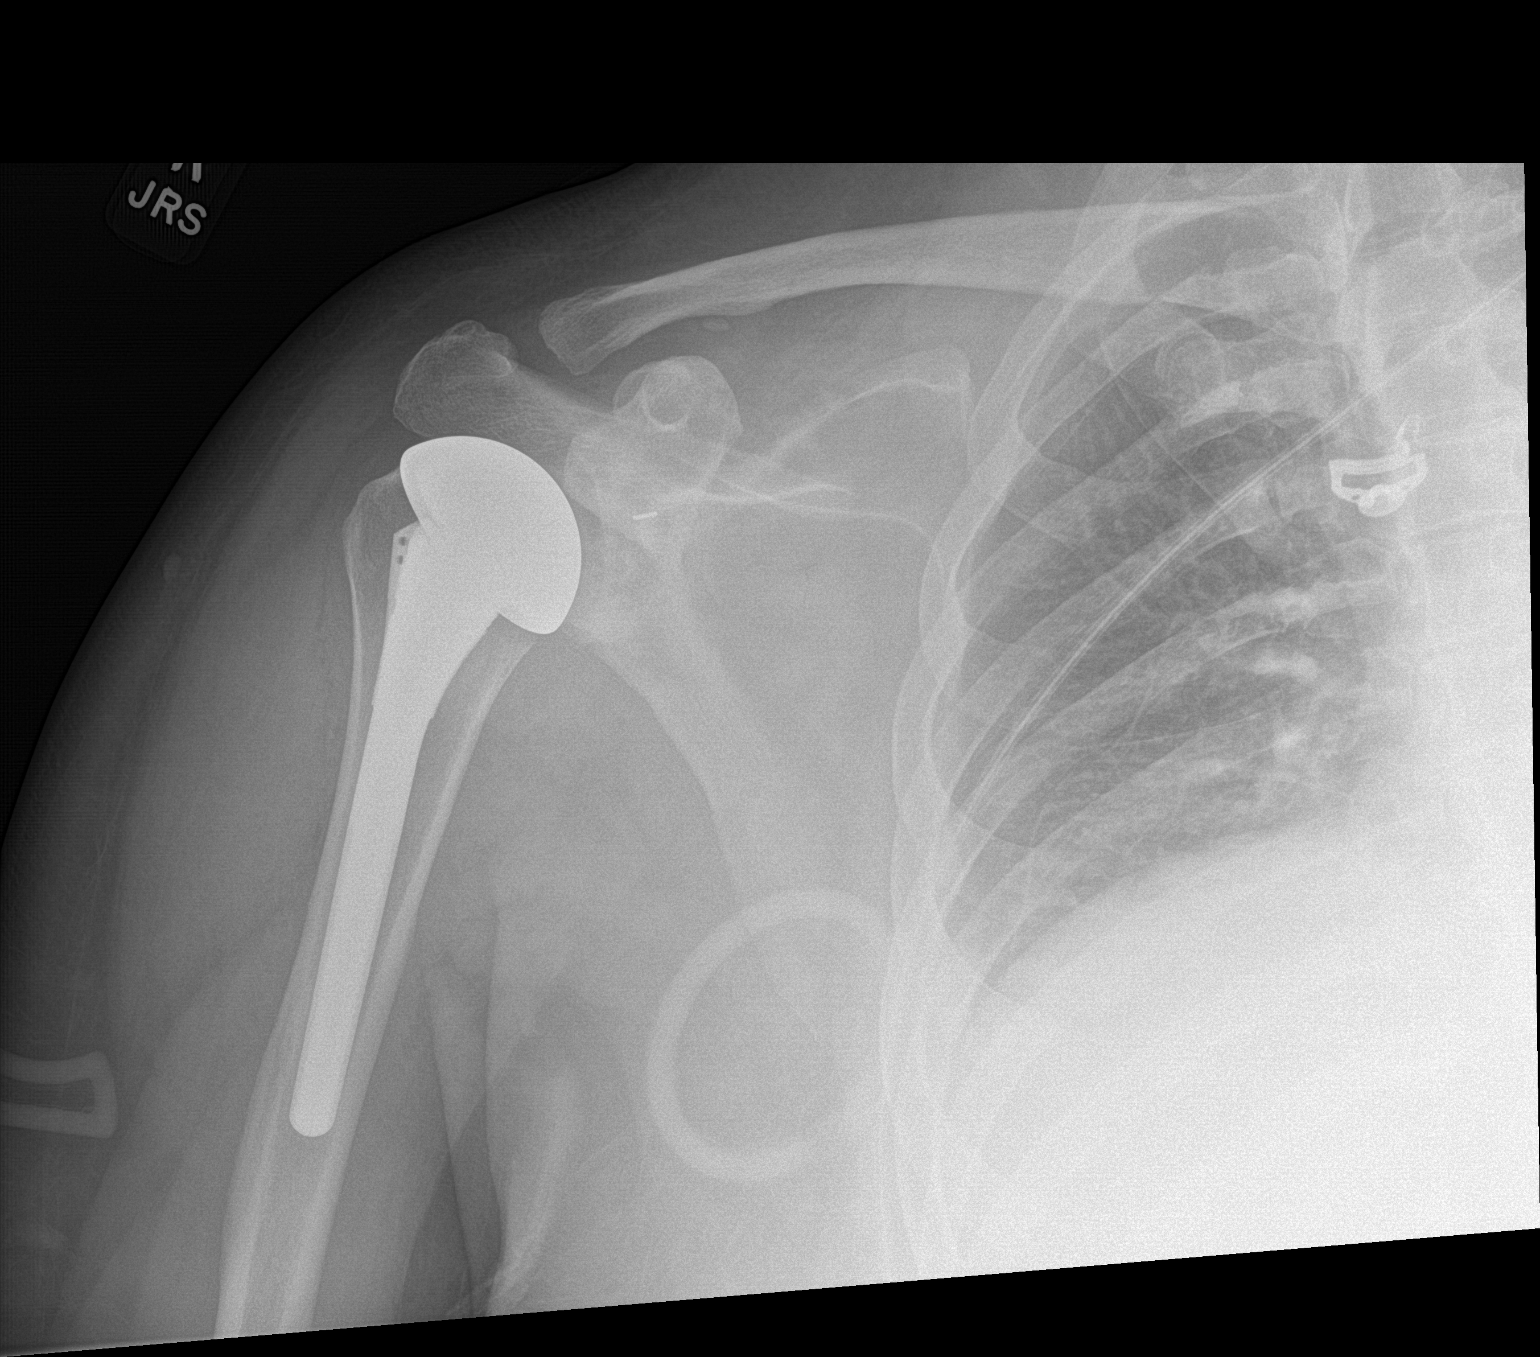

[1 of 1 positions shown; findings below may reference images not displayed]

FINDINGS: There is a right shoulder arthroplasty in anatomic alignment. There
is no acute fracture or hardware loosening. Joint spaces are
maintained. Soft tissues are within normal limits.
IMPRESSION: 1. Right shoulder arthroplasty in anatomic alignment.
